# Patient Record
Sex: Female | Born: 1980 | Hispanic: Yes | Marital: Married | Smoking: Former smoker
Health system: Southern US, Community
[De-identification: ages and names within clinical notes are randomized; demographics above are authoritative.]

## PROBLEM LIST (undated history)

## (undated) DIAGNOSIS — M5126 Other intervertebral disc displacement, lumbar region: Secondary | ICD-10-CM

## (undated) DIAGNOSIS — G43909 Migraine, unspecified, not intractable, without status migrainosus: Secondary | ICD-10-CM

## (undated) DIAGNOSIS — J45909 Unspecified asthma, uncomplicated: Secondary | ICD-10-CM

---

## 2015-10-15 ENCOUNTER — Encounter (HOSPITAL_COMMUNITY): Payer: Self-pay | Admitting: Emergency Medicine

## 2015-10-15 ENCOUNTER — Emergency Department (HOSPITAL_COMMUNITY): Payer: Managed Care, Other (non HMO)

## 2015-10-15 ENCOUNTER — Emergency Department (HOSPITAL_COMMUNITY)
Admission: EM | Admit: 2015-10-15 | Discharge: 2015-10-15 | Disposition: A | Discharge status: Home / Self Care | Payer: Managed Care, Other (non HMO) | Attending: Emergency Medicine | Admitting: Emergency Medicine

## 2015-10-15 DIAGNOSIS — Z79899 Other long term (current) drug therapy: Secondary | ICD-10-CM | POA: Diagnosis not present

## 2015-10-15 DIAGNOSIS — J45909 Unspecified asthma, uncomplicated: Secondary | ICD-10-CM | POA: Insufficient documentation

## 2015-10-15 DIAGNOSIS — Z87891 Personal history of nicotine dependence: Secondary | ICD-10-CM | POA: Diagnosis not present

## 2015-10-15 DIAGNOSIS — R51 Headache: Secondary | ICD-10-CM | POA: Insufficient documentation

## 2015-10-15 DIAGNOSIS — R519 Headache, unspecified: Secondary | ICD-10-CM

## 2015-10-15 HISTORY — DX: Migraine, unspecified, not intractable, without status migrainosus: G43.909

## 2015-10-15 HISTORY — DX: Other intervertebral disc displacement, lumbar region: M51.26

## 2015-10-15 HISTORY — DX: Unspecified asthma, uncomplicated: J45.909

## 2015-10-15 MED ORDER — METHYLPREDNISOLONE 4 MG PO TBPK: ORAL_TABLET | ORAL | Status: DC

## 2015-10-15 MED ORDER — KETOROLAC TROMETHAMINE 60 MG/2ML IM SOLN
60.0000 mg | Freq: Once | INTRAMUSCULAR | Status: AC
  Administered 2015-10-15: 60 mg via INTRAMUSCULAR
  Filled 2015-10-15: qty 2

## 2015-10-15 MED ORDER — DEXAMETHASONE SODIUM PHOSPHATE 10 MG/ML IJ SOLN
6.0000 mg | Freq: Once | INTRAMUSCULAR | Status: AC
  Administered 2015-10-15: 6 mg via INTRAMUSCULAR
  Filled 2015-10-15: qty 1

## 2015-10-15 MED ORDER — IBUPROFEN 800 MG PO TABS: 800.0000 mg | ORAL_TABLET | Freq: Three times a day (TID) | ORAL | Status: DC

## 2015-10-15 NOTE — ED Provider Notes (Signed)
CSN: [id number]     Arrival date & time 10/15/15  1205 History   First MD Initiated Contact with Patient 10/15/15 1305     Chief Complaint  Patient presents with  . Headache     (Consider location/radiation/quality/duration/timing/severity/associated sxs/prior Treatment) HPI  The pt has had headache for 24 hours - this headache is worse behind her eyes and is throbbing with dizzines and light sensitivity - no changes in vision and no numbness or weakness of arms or legs other than LUE numbness into the L 4th and 5th fingers.  This HA is constant, the arm sx have bee present for 2 weeks intermittently - not to bad at this time - the sx radiate from her neck on the L down her L arm and is sharp pain with pins and needles feeling. -she reports having MRI's of the neck and lower back showing lots of bone spurs and arthritis as well as some herniated disks.  She has used muscle relaxer and opiates at home with minimal relief of arm sx.    Past Medical History  Diagnosis Date  . Migraine   . Lumbar herniated disc   . Asthma     as child   History reviewed. No pertinent past surgical history. No family history on file. Social History  Substance Use Topics  . Smoking status: Former Games developermoker  . Smokeless tobacco: None  . Alcohol Use: No   OB History    No data available     Review of Systems  All other systems reviewed and are negative.     Allergies  Review of patient's allergies indicates no known allergies.  Home Medications   Prior to Admission medications   Medication Sig Start Date End Date Taking? Authorizing Provider  aspirin-acetaminophen-caffeine (EXCEDRIN MIGRAINE) [id number] [id number] MG tablet Take 2 tablets by mouth every 6 (six) hours as needed for headache.   Yes Historical Provider, MD  cetirizine (ZYRTEC) 10 MG tablet Take 10 mg by mouth daily.   Yes Historical Provider, MD  cyclobenzaprine (FLEXERIL) 10 MG tablet Take 10 mg by mouth 3 (three) times daily as needed for  muscle spasms.   Yes Historical Provider, MD  naproxen (NAPROSYN) 500 MG tablet Take 500 mg by mouth 2 (two) times daily with a meal.   Yes Historical Provider, MD  naproxen sodium (ANAPROX) 220 MG tablet Take 440 mg by mouth 2 (two) times daily with a meal.   Yes Historical Provider, MD  oxyCODONE-acetaminophen (PERCOCET/ROXICET) 5-325 MG tablet Take 1 tablet by mouth every 4 (four) hours as needed for severe pain.   Yes Historical Provider, MD  ibuprofen (ADVIL,MOTRIN) 800 MG tablet Take 1 tablet (800 mg total) by mouth 3 (three) times daily. 10/15/15   Eber HongBrian Paeton Latouche, MD  methylPREDNISolone (MEDROL DOSEPAK) 4 MG TBPK tablet Taper over 6 days 10/15/15   Eber HongBrian Kerri Asche, MD   BP 108/74 mmHg  Pulse 75  Temp(Src) 97.9 F (36.6 C) (Oral)  Resp 16  Ht 5\' 3"  (1.6 m)  Wt 180 lb (81.647 kg)  BMI 31.89 kg/m2  SpO2 99%  LMP 09/02/2015 Physical Exam  Constitutional: She appears well-developed and well-nourished. No distress.  HENT:  Head: Normocephalic and atraumatic.  Mouth/Throat: Oropharynx is clear and moist. No oropharyngeal exudate.  Eyes: Conjunctivae and EOM are normal. Right eye exhibits no discharge. Left eye exhibits no discharge. No scleral icterus.  R pupil larger than the L but both are reactive.  Neck: Normal range of motion. Neck supple. No JVD  present. No thyromegaly present.  Cardiovascular: Normal rate, regular rhythm, normal heart sounds and intact distal pulses.  Exam reveals no gallop and no friction rub.   No murmur heard. Pulmonary/Chest: Effort normal and breath sounds normal. No respiratory distress. She has no wheezes. She has no rales.  Abdominal: Soft. Bowel sounds are normal. She exhibits no distension and no mass. There is no tenderness.  Musculoskeletal: Normal range of motion. She exhibits no edema or tenderness.  Lymphadenopathy:    She has no cervical adenopathy.  Neurological: She is alert. Coordination normal.  Neurologic exam:  Speech clear, pupils equal round  reactive to light, extraocular movements intact  Normal peripheral visual fields Cranial nerves III through XII normal including no facial droop Follows commands, moves all extremities x4, normal strength to bilateral upper and lower extremities at all major muscle groups including grip Sensation normal to light touch and pinprick other than the 4th and 5th digits of the LUE.  Normal strength of L hand and ulnar nerve. Coordination intact, no limb ataxia, finger-nose-finger normal Rapid alternating movements normal No pronator drift Gait normal   Skin: Skin is warm and dry. No rash noted. No erythema.  Psychiatric: She has a normal mood and affect. Her behavior is normal.  Nursing note and vitals reviewed.   ED Course  Procedures (including critical care time) Labs Review Labs Reviewed - No data to display  Imaging Review Ct Head Wo Contrast  10/15/2015  CLINICAL DATA:  Headache. Photosensitivity. Nausea. Dizziness. Left arm pain for the past 2 weeks. EXAM: CT HEAD WITHOUT CONTRAST TECHNIQUE: Contiguous axial images were obtained from the base of the skull through the vertex without intravenous contrast. COMPARISON:  None. FINDINGS: Normal appearing cerebral hemispheres and posterior fossa structures. Normal size and position of the ventricles. No intracranial hemorrhage, mass lesion or CT evidence of acute infarction. Unremarkable bones and included paranasal sinuses. IMPRESSION: Normal examination. Electronically Signed   By: Beckie Salts M.D.   On: 10/15/2015 14:56   I have personally reviewed and evaluated these images and lab results as part of my medical decision-making.    MDM   Final diagnoses:  Nonintractable headache, unspecified chronicity pattern, unspecified headache type    Pt has new headache (states gets them occasionally) but has anisocoria - CT head Has neck pain with neuro sx suggesting underlying radiculopathy - has had imaging - no weakness nsaids /  steroids Pt in agreement.  Improved after meds No headache on d/c Xray neg  F/u list given  Meds given in ED:  Medications  dexamethasone (DECADRON) injection 6 mg (6 mg Intramuscular Given 10/15/15 1415)  ketorolac (TORADOL) injection 60 mg (60 mg Intramuscular Given 10/15/15 1416)    New Prescriptions   IBUPROFEN (ADVIL,MOTRIN) 800 MG TABLET    Take 1 tablet (800 mg total) by mouth 3 (three) times daily.   METHYLPREDNISOLONE (MEDROL DOSEPAK) 4 MG TBPK TABLET    Taper over 6 days      Eber Hong, MD 10/15/15 1537

## 2015-10-15 NOTE — Discharge Instructions (Signed)
[facility] Doctor List ° ° ° °Edward Hawkins MD. Specialty: Pulmonary Disease Contact information: [address]  °[address]  °[place] [address]  °[phone]  ° °Margaret Simpson, MD. Specialty: Family Medicine Contact information: [address]  °[place] [address]  °[phone]  ° °Scott Luking, MD. Specialty: Family Medicine Contact information: [address]  °[address]  °[place] [address]  °[phone]  ° °Tesfaye Fanta, MD Specialty: Internal Medicine Contact information: [address]  °[place] [address]  °[phone]  ° °Zach Hall, MD. Specialty: Internal Medicine Contact information: [address]  °[place] [address]  °[phone]  ° °Angus Mcinnis, MD. Specialty: Family Medicine Contact information: [address]  °[place] [address]  °[phone]  ° °Stephen Knowlton, MD. Specialty: Family Medicine Contact information: [address]  °[address]  °[place] [address]  °[phone]  ° °Roy Fagan, MD. Specialty: Internal Medicine Contact information: [address]  °[address]  °[place] [address]  °[phone]  ° ° °

## 2015-10-15 NOTE — ED Notes (Signed)
Pt c/o HA, light sensitivity, nausea, intermittent dizziness, and LT arm pain x 2 weeks. Pt has hx of herniated discs in lower back and pt states this sometimes causes symptoms.

## 2017-02-18 ENCOUNTER — Other Ambulatory Visit: Payer: Self-pay | Admitting: Family Medicine

## 2017-02-18 ENCOUNTER — Other Ambulatory Visit (HOSPITAL_COMMUNITY)
Admission: RE | Admit: 2017-02-18 | Discharge: 2017-02-18 | Disposition: A | Discharge status: Home / Self Care | Payer: Managed Care, Other (non HMO) | Source: Ambulatory Visit | Attending: Family Medicine | Admitting: Family Medicine

## 2017-02-18 DIAGNOSIS — Z01411 Encounter for gynecological examination (general) (routine) with abnormal findings: Secondary | ICD-10-CM | POA: Insufficient documentation

## 2017-02-20 LAB — CYTOLOGY - PAP
Adequacy: ABSENT
Diagnosis: NEGATIVE
HPV: NOT DETECTED

## 2019-05-25 ENCOUNTER — Other Ambulatory Visit: Payer: Self-pay

## 2019-05-25 DIAGNOSIS — Z20822 Contact with and (suspected) exposure to covid-19: Secondary | ICD-10-CM

## 2019-05-26 LAB — NOVEL CORONAVIRUS, NAA: SARS-CoV-2, NAA: NOT DETECTED

## 2020-08-28 ENCOUNTER — Ambulatory Visit: Payer: 59 | Admitting: Neurology

## 2020-12-06 ENCOUNTER — Other Ambulatory Visit: Payer: Self-pay

## 2020-12-06 ENCOUNTER — Encounter (HOSPITAL_COMMUNITY): Payer: Self-pay | Admitting: *Deleted

## 2020-12-06 ENCOUNTER — Emergency Department (HOSPITAL_COMMUNITY)
Admission: EM | Admit: 2020-12-06 | Discharge: 2020-12-06 | Disposition: A | Discharge status: Home / Self Care | Payer: 59 | Attending: Emergency Medicine | Admitting: Emergency Medicine

## 2020-12-06 ENCOUNTER — Emergency Department (HOSPITAL_COMMUNITY): Payer: 59

## 2020-12-06 DIAGNOSIS — R0789 Other chest pain: Secondary | ICD-10-CM | POA: Insufficient documentation

## 2020-12-06 DIAGNOSIS — J45909 Unspecified asthma, uncomplicated: Secondary | ICD-10-CM | POA: Diagnosis not present

## 2020-12-06 DIAGNOSIS — R519 Headache, unspecified: Secondary | ICD-10-CM | POA: Insufficient documentation

## 2020-12-06 DIAGNOSIS — Z87891 Personal history of nicotine dependence: Secondary | ICD-10-CM | POA: Diagnosis not present

## 2020-12-06 LAB — BASIC METABOLIC PANEL
Anion gap: 7 (ref 5–15)
BUN: 13 mg/dL (ref 6–20)
CO2: 24 mmol/L (ref 22–32)
Calcium: 8.1 mg/dL — ABNORMAL LOW (ref 8.9–10.3)
Chloride: 106 mmol/L (ref 98–111)
Creatinine, Ser: 0.73 mg/dL (ref 0.44–1.00)
GFR, Estimated: >60 mL/min (ref >60)
Glucose, Bld: 114 mg/dL — ABNORMAL HIGH (ref 70–99)
Potassium: 3.5 mmol/L (ref 3.5–5.1)
Sodium: 137 mmol/L (ref 135–145)

## 2020-12-06 LAB — CBC
HCT: 38.2 % (ref 36.0–46.0)
Hemoglobin: 11.5 g/dL — ABNORMAL LOW (ref 12.0–15.0)
MCH: 24.9 pg — ABNORMAL LOW (ref 26.0–34.0)
MCHC: 30.1 g/dL (ref 30.0–36.0)
MCV: 82.7 fL (ref 80.0–100.0)
Platelets: 346 10*3/uL (ref 150–400)
RBC: 4.62 MIL/uL (ref 3.87–5.11)
RDW: 13.8 % (ref 11.5–15.5)
WBC: 8.2 10*3/uL (ref 4.0–10.5)
nRBC: 0 % (ref 0.0–0.2)

## 2020-12-06 LAB — TROPONIN I (HIGH SENSITIVITY)
Troponin I (High Sensitivity): <2 ng/L (ref <18)
Troponin I (High Sensitivity): 2 ng/L (ref <18)

## 2020-12-06 MED ORDER — CYCLOBENZAPRINE HCL 10 MG PO TABS: 10.0000 mg | ORAL_TABLET | Freq: Two times a day (BID) | ORAL | 0 refills | Status: AC | PRN

## 2020-12-06 MED ORDER — ONDANSETRON HCL 4 MG/2ML IJ SOLN
4.0000 mg | Freq: Once | INTRAMUSCULAR | Status: AC
  Administered 2020-12-06: 4 mg via INTRAVENOUS
  Filled 2020-12-06: qty 2

## 2020-12-06 MED ORDER — METOCLOPRAMIDE HCL 5 MG/ML IJ SOLN
10.0000 mg | Freq: Once | INTRAMUSCULAR | Status: AC
  Administered 2020-12-06: 10 mg via INTRAVENOUS
  Filled 2020-12-06: qty 2

## 2020-12-06 MED ORDER — SODIUM CHLORIDE 0.9 % IV BOLUS
1000.0000 mL | Freq: Once | INTRAVENOUS | Status: AC
  Administered 2020-12-06: 1000 mL via INTRAVENOUS

## 2020-12-06 MED ORDER — CYCLOBENZAPRINE HCL 10 MG PO TABS
10.0000 mg | ORAL_TABLET | Freq: Once | ORAL | Status: AC
  Administered 2020-12-06: 10 mg via ORAL
  Filled 2020-12-06: qty 1

## 2020-12-06 MED ORDER — DIPHENHYDRAMINE HCL 50 MG/ML IJ SOLN
25.0000 mg | Freq: Once | INTRAMUSCULAR | Status: AC
  Administered 2020-12-06: 25 mg via INTRAVENOUS
  Filled 2020-12-06: qty 1

## 2020-12-06 NOTE — ED Provider Notes (Signed)
[facility]   CSN: [id number] Arrival date & time: 12/06/20  1124     History Chief Complaint  Patient presents with  . Headache    [facility] is a 40 y.o. female.  HPI  Patient with significant medical history of asthma, migraines presents to the emergency [facility] with chief complaint of migraines.  Patient states she has been having headache for the last 2 days, states the pain comes and goes, states is on the left side of her head, feels like a pressure-like sensation, she has intermittent paresthesias in the upper extremities, denies any weakness, currently has no paresthesias at this time, she denies recent head trauma, is not on anticoagulant, she does endorse that she will see floaters in her left eye this only happens when she goes from a lighted area into dark area and will resolve on own.  She has no history of IV drug use, denies associated fevers or chills, she states that this headache came on suddenly but it has gradually gotten worse.  States this feels different than her normal migraines.  She also comes in with complaints of left-sided chest pain, started today, this pain is intermittent, does not radiate, not associated with shortness of breath, nausea, vomiting, lightheadedness or dizziness, she has no cardiac history, no history of PEs or DVTs, currently not on hormone therapy.  Patient denies any alleviating factors.  Past Medical History:  Diagnosis Date  . Asthma    as child  . Lumbar herniated disc   . Migraine     There are no problems to display for this patient.   History reviewed. No pertinent surgical history.   OB History   No obstetric history on file.     No family history on file.  Social History   Tobacco Use  . Smoking status: Former    Pack years: 0.00  . Smokeless tobacco: Never  Substance Use Topics  . Alcohol use: No    Home Medications Prior to Admission medications   Medication Sig  Start Date End Date Taking? Authorizing Provider  cetirizine (ZYRTEC) 10 MG tablet Take 10 mg by mouth daily.   Yes [provider]  cyclobenzaprine (FLEXERIL) 10 MG tablet Take 1 tablet (10 mg total) by mouth 2 (two) times daily as needed for muscle spasms. 12/06/20  Yes Carroll SageFaulkner, Calia Napp J, PA-C  naproxen (NAPROSYN) 500 MG tablet Take 500 mg by mouth 2 (two) times daily with a meal.   Yes [provider]  oxyCODONE-acetaminophen (PERCOCET/ROXICET) 5-325 MG tablet Take 1 tablet by mouth every 4 (four) hours as needed for severe pain.   Yes [provider]  ibuprofen (ADVIL,MOTRIN) 800 MG tablet Take 1 tablet (800 mg total) by mouth 3 (three) times daily. Patient not taking: Reported on 12/06/2020 10/15/15   Eber HongMiller, Brian, MD  methylPREDNISolone (MEDROL DOSEPAK) 4 MG TBPK tablet Taper over 6 days Patient not taking: Reported on 12/06/2020 10/15/15   Eber HongMiller, Brian, MD  naproxen sodium (ANAPROX) 220 MG tablet Take 440 mg by mouth 2 (two) times daily with a meal. Patient not taking: Reported on 12/06/2020    [provider]    Allergies    Patient has no known allergies.  Review of Systems   Review of Systems  Constitutional:  Negative for chills and fever.  HENT:  Negative for congestion.   Eyes:  Positive for photophobia.  Respiratory:  Negative for shortness of breath.   Cardiovascular:  Positive for chest  pain.  Gastrointestinal:  Positive for nausea. Negative for abdominal pain, diarrhea and vomiting.  Genitourinary:  Negative for enuresis.  Musculoskeletal:  Negative for back pain.  Skin:  Negative for rash.  Neurological:  Positive for dizziness and headaches. Negative for facial asymmetry.  Hematological:  Does not bruise/bleed easily.   Physical Exam Updated Vital Signs BP 98/71   Pulse 63   Temp 98.4 F (36.9 C) (Oral)   Resp 15   LMP 11/22/2020 (Within Weeks)   SpO2 96%   Physical Exam Vitals and nursing note reviewed.  Constitutional:       General: She is not in acute distress.    Appearance: She is not ill-appearing.  HENT:     Head: Normocephalic and atraumatic.     Nose: No congestion.  Eyes:     General: No visual field deficit.    Extraocular Movements: Extraocular movements intact.     Conjunctiva/sclera: Conjunctivae normal.     Pupils: Pupils are equal, round, and reactive to light.  Cardiovascular:     Rate and Rhythm: Normal rate and regular rhythm.     Pulses: Normal pulses.     Heart sounds: No murmur heard.   No friction rub. No gallop.  Pulmonary:     Effort: No respiratory distress.     Breath sounds: No wheezing, rhonchi or rales.  Abdominal:     Palpations: Abdomen is soft.     Tenderness: There is no abdominal tenderness.  Musculoskeletal:     Comments: Patient has 5 of 5 strength, neurovascularly intact in the upper and lower extremities.  Skin:    General: Skin is warm and dry.  Neurological:     Mental Status: She is alert.     GCS: GCS eye subscore is 4. GCS verbal subscore is 5. GCS motor subscore is 6.     Cranial Nerves: No facial asymmetry.     Motor: No weakness.     Coordination: Romberg sign negative. Finger-Nose-Finger Test normal.     Comments: Cranial nerves II through XII are grossly intact  Patient is having no difficulty with word finding.  Patient states the sensation on her right upper extremity and right side of her face feels rough compared to her left side.  Psychiatric:        Mood and Affect: Mood normal.    ED Results / Procedures / Treatments   Labs (all labs ordered are listed, but only abnormal results are displayed) Labs Reviewed  BASIC METABOLIC PANEL - Abnormal; Notable for the following components:      Result Value   Glucose, Bld 114 (*)    Calcium 8.1 (*)    All other components within normal limits  CBC - Abnormal; Notable for the following components:   Hemoglobin 11.5 (*)    MCH 24.9 (*)    All other components within normal limits  TROPONIN I  (HIGH SENSITIVITY)  TROPONIN I (HIGH SENSITIVITY)    EKG None  Radiology DG Chest 2 View  Result Date: 12/06/2020 CLINICAL DATA:  Chest pain.  Headache.  Symptoms for 2 days. EXAM: CHEST - 2 VIEW COMPARISON:  None. FINDINGS: Heart size is normal. The lungs are free of focal consolidations and pleural effusions. No pulmonary edema. IMPRESSION: No active cardiopulmonary disease. Electronically Signed   By: Norva Pavlov M.D.   On: 12/06/2020 12:44   CT Head Wo Contrast  Result Date: 12/06/2020 CLINICAL DATA:  Headache, chronic, new features or increased frequency. Additional history  provided: Headache with left eye pain, cramps in legs with weakness. EXAM: CT HEAD WITHOUT CONTRAST TECHNIQUE: Contiguous axial images were obtained from the base of the skull through the vertex without intravenous contrast. COMPARISON:  Prior head CT 10/15/2015. FINDINGS: Brain: Cerebral volume is normal for age. There is no acute intracranial hemorrhage. No demarcated cortical infarct. No extra-axial fluid collection. No evidence of an intracranial mass. No midline shift. Vascular: No hyperdense vessel. Skull: No calvarial fracture or focal suspicious osseous lesion. Sinuses/Orbits: Visualized orbits show no acute finding. Minimal bilateral ethmoid and sphenoid sinus mucosal thickening at the imaged levels. IMPRESSION: Unremarkable non-contrast CT appearance of the brain. No evidence of acute intracranial abnormality. Minimal bilateral ethmoid and sphenoid sinus mucosal thickening at the imaged levels. Electronically Signed   By: Jackey Loge DO   On: 12/06/2020 14:04    Procedures Procedures   Medications Ordered in ED Medications  sodium chloride 0.9 % bolus 1,000 mL (1,000 mLs Intravenous New Bag/Given 12/06/20 1309)  ondansetron (ZOFRAN) injection 4 mg (4 mg Intravenous Given 12/06/20 1310)  metoCLOPramide (REGLAN) injection 10 mg (10 mg Intravenous Given 12/06/20 1312)  diphenhydrAMINE (BENADRYL) injection 25  mg (25 mg Intravenous Given 12/06/20 1313)  cyclobenzaprine (FLEXERIL) tablet 10 mg (10 mg Oral Given 12/06/20 1309)    ED Course  I have reviewed the triage vital signs and the nursing notes.  Pertinent labs & imaging results that were available during my care of the patient were reviewed by me and considered in my medical decision making (see chart for details).    MDM Rules/Calculators/A&P                         Initial impression-patient presents with a migraine and chest pain.  She is alert, does not appear in acute stress, vital signs reassuring.  I suspect patient is having from a simple migraine, will provide patient with migraine cocktail, CT head as she endorses new type of headache.  Suspect chest pain is musculoskeletal will obtain basic lab work-up, provide patient with Flexeril and reassess.  Work-up-CBC shows normocytic anemia with a hemoglobin 11.5, BMP shows slight hyperglycemia 114, negative delta troponin. chest x-ray unremarkable, EKG sinus without signs of ischemia CT head negative for acute findings.  Reassessment-patient was reassessed, states she is feeling much better, has no complaints this time.  Vital signs remained stable.  will continue to monitor.  Patient is reassessed continued no complaints this time, vital signs remained stable, patient is agreement for discharge at this time.  Rule out-I have low suspicion for intracranial head bleed or intracranial mass as CT head is negative for acute findings.  Low suspicion for CVA or subarachnoid bleed as there is no focal deficits present my exam, history is atypical as headache came on gradually, patient has low risk factors.  Low suspicion for systemic infection and/or meningitis as white count was within normal limits, vital signs reassuring, no meningeal sign present my exam.  I have low suspicion for ACS as history is atypical, patient has no cardiac history, EKG was sinus rhythm without signs of ischemia, patient had  a delta troponin.  Low suspicion for PE as patient denies pleuritic chest pain, shortness of breath, patient denies leg pain, no pedal edema noted on exam, patient was PERC negative.  Low suspicion for AAA or aortic dissection as history is atypical, patient has low risk factors.  Low suspicion for systemic infection as patient is nontoxic-appearing, vital signs reassuring,  no obvious source infection noted on exam.   Plan-  Headache since resolved-suspect patient suffering from a migraine, will recommend she follows up with neurology for further evaluation. Chest pain since resolved-suspect musculoskeletal as she improved with a muscle relaxer, will provide her with muscle relaxers, follow with PCP as needed.  Vital signs have remained stable, no indication for hospital admission.   Patient given at home care as well strict return precautions.  Patient verbalized that they understood agreed to said plan.  Final Clinical Impression(s) / ED Diagnoses Final diagnoses:  Bad headache  Atypical chest pain    Rx / DC Orders ED Discharge Orders          Ordered    cyclobenzaprine (FLEXERIL) 10 MG tablet  2 times daily PRN        12/06/20 1506             Barnie Del 12/06/20 1510    Bethann Berkshire, MD 12/10/20 [phone]

## 2020-12-06 NOTE — ED Triage Notes (Signed)
States she also has chest pain

## 2020-12-06 NOTE — Discharge Instructions (Addendum)
Lab work and imaging are reassuring.  I suspect you had a migraine.  I recommend over-the-counter pain medications like ibuprofen and Tylenol every 6 hours as needed please follow dosing on the back of bottle.  Please stay hydrated, exercise regularly, decrease stress as these can help decrease frequency of migraines.  I suspect chest pain is a muscular strain, recommend Flexeril as needed for pain relief.  Please beware that this medication make you drowsy do not consume alcohol or operate heavy machinery when taking this medication.  Please follow-up with neurology for further evaluation.  Come back to the emergency department if you develop chest pain, shortness of breath, severe abdominal pain, uncontrolled nausea, vomiting, diarrhea.

## 2020-12-06 NOTE — ED Triage Notes (Signed)
Sent from urgent care for evaluation, headache for 2 days

## 2020-12-10 ENCOUNTER — Encounter: Payer: Self-pay | Admitting: Neurology

## 2021-02-27 NOTE — Progress Notes (Signed)
NEUROLOGY CONSULTATION NOTE  Rhonda Jimenez MRN: [id number] DOB: Sep 15, 1980  Referring provider: Bethann Berkshire, MD (ED referral) Primary care provider: No PCP  Reason for consult:  headache  Assessment/Plan:   New onset headache, different from typical migraine but likely intractable migraine with aura.  Given new symptoms (speech/language disturbance and right sided numbness), must evaluate for secondary intracranial etiology Migraine without aura, without status migrainosus, not intractable  MRI of brain with and without contrast Typical migraines are infrequent so would defer starting a preventative For migraine rescue:  rizatriptan 10mg  and Zofran 4mg  Limit use of pain relievers to no more than 2 days out of week to prevent risk of rebound or medication-overuse headache. Keep headache diary Follow up 6 months.      Subjective:  Rhonda Jimenez is a 40 year old female with asthma and migraines who presents for migraines.  ED note reviewed.  In June 2022, she developed a new headache described as a severe right occipital exploding headache radiating forward along the right side of her head.  She has gray out of vision on the lower half of visual field in right eye lasting one hour.  Headache associated with nausea and photophobia.  Over the course of 2 weeks during this period, she reported stuttering speech and word-finding difficulty as well as tingling and shooting pain in all extremities.  She also noted dysesthesia involving the right arm and neck.  NO WEAKNESS.  Tried treating headache with naproxen and Excedrin with no meaningful improvement.  She was seen in the ED on 12/06/2020.  CT head personally reviewed was unremarkable. After 2 weeks, she has not had a recurrence of these headaches or symptoms.  Usual migraines described as holocephalic throbbing and stabbing pain associated with nausea, photophobia and phonophobia usually lasting a day (up to 3 days) and occurring 2-3 times  a month.    Current NSAIDS/analgesics:  naproxen 500mg  BID (chronic back pain), oxycodone-acetaminophen (chronic low back pain) Current triptans:  none Current ergotamine:  none Current anti-emetic:  none Current muscle relaxants:  cyclobenzaprine 10mg  BID PRN (chronic back pain/spasms) Current Antihypertensive medications:  none Current Antidepressant medications:  none Current Anticonvulsant medications:  none Current anti-CGRP:  none Current Vitamins/Herbal/Supplements:  none Current Antihistamines/Decongestants:  cetirizine Other therapy:  none Hormone/birth control:  none  Past NSAIDS/analgesics:  ibuprofen, Excedrin Migraine none Past abortive triptans:  none Past abortive ergotamine:  none Past muscle relaxants:  none Past anti-emetic:  none Past antihypertensive medications:  none Past antidepressant medications:  none Past anticonvulsant medications:  none Past anti-CGRP:  none Past vitamins/Herbal/Supplements:  none Past antihistamines/decongestants:  none Other past therapies:  none  Family history of headache:  no      PAST MEDICAL HISTORY: Past Medical History:  Diagnosis Date   Asthma    as child   Lumbar herniated disc    Migraine     PAST SURGICAL HISTORY: No past surgical history on file.  MEDICATIONS: Current Outpatient Medications on File Prior to Visit  Medication Sig Dispense Refill   cetirizine (ZYRTEC) 10 MG tablet Take 10 mg by mouth daily.     cyclobenzaprine (FLEXERIL) 10 MG tablet Take 1 tablet (10 mg total) by mouth 2 (two) times daily as needed for muscle spasms. 20 tablet 0   ibuprofen (ADVIL,MOTRIN) 800 MG tablet Take 1 tablet (800 mg total) by mouth 3 (three) times daily. (Patient not taking: Reported on 12/06/2020) 21 tablet 0   methylPREDNISolone (MEDROL DOSEPAK) 4 MG TBPK tablet  Taper over 6 days (Patient not taking: Reported on 12/06/2020) 21 tablet 0   naproxen (NAPROSYN) 500 MG tablet Take 500 mg by mouth 2 (two) times daily  with a meal.     naproxen sodium (ANAPROX) 220 MG tablet Take 440 mg by mouth 2 (two) times daily with a meal. (Patient not taking: Reported on 12/06/2020)     oxyCODONE-acetaminophen (PERCOCET/ROXICET) 5-325 MG tablet Take 1 tablet by mouth every 4 (four) hours as needed for severe pain.     No current facility-administered medications on file prior to visit.    ALLERGIES: No Known Allergies  FAMILY HISTORY: No family history on file.  Objective:  Blood pressure 121/79, pulse 99, height 5\' 3"  (1.6 m), weight 190 lb 6.4 oz (86.4 kg). General: No acute distress.  Patient appears well-groomed.   Head:  Normocephalic/atraumatic Eyes:  fundi examined but not visualized Neck: supple, no paraspinal tenderness, full range of motion Back: No paraspinal tenderness Heart: regular rate and rhythm Lungs: Clear to auscultation bilaterally. Vascular: No carotid bruits. Neurological Exam: Mental status: alert and oriented to person, place, and time, recent and remote memory intact, fund of knowledge intact, attention and concentration intact, speech fluent and not dysarthric, language intact. Cranial nerves: CN I: not tested CN II: pupils equal, round and reactive to light, visual fields intact CN III, IV, VI:  full range of motion, no nystagmus, no ptosis CN V: reports decreased sensation left V1 CN VII: upper and lower face symmetric CN VIII: hearing intact CN IX, X: gag intact, uvula midline CN XI: sternocleidomastoid and trapezius muscles intact CN XII: tongue midline Bulk & Tone: normal, no fasciculations. Motor:  muscle strength 5/5 throughout Sensation:  reports reduced pinprick in right upper extremity (inconsistent).  Vibratory sensation intact. Deep Tendon Reflexes:  2+ throughout,  toes downgoing.   Finger to nose testing:  Without dysmetria.   Heel to shin:  Without dysmetria.   Gait:  Normal station and stride.  Romberg negative.    Thank you for allowing me to take part in  the care of this patient.  , DO

## 2021-02-28 ENCOUNTER — Other Ambulatory Visit: Payer: Self-pay

## 2021-02-28 ENCOUNTER — Ambulatory Visit (INDEPENDENT_AMBULATORY_CARE_PROVIDER_SITE_OTHER): Payer: 59 | Admitting: Neurology

## 2021-02-28 ENCOUNTER — Encounter: Payer: Self-pay | Admitting: Neurology

## 2021-02-28 VITALS — BP 121/79 | HR 99 | Ht 63.0 in | Wt 190.4 lb

## 2021-02-28 DIAGNOSIS — G43009 Migraine without aura, not intractable, without status migrainosus: Secondary | ICD-10-CM

## 2021-02-28 DIAGNOSIS — R200 Anesthesia of skin: Secondary | ICD-10-CM | POA: Diagnosis not present

## 2021-02-28 DIAGNOSIS — R479 Unspecified speech disturbances: Secondary | ICD-10-CM

## 2021-02-28 DIAGNOSIS — R519 Headache, unspecified: Secondary | ICD-10-CM | POA: Diagnosis not present

## 2021-02-28 MED ORDER — ONDANSETRON 4 MG PO TBDP: 4.0000 mg | ORAL_TABLET | Freq: Three times a day (TID) | ORAL | 5 refills | Status: AC | PRN

## 2021-02-28 MED ORDER — RIZATRIPTAN BENZOATE 10 MG PO TBDP: 10.0000 mg | ORAL_TABLET | ORAL | 5 refills | Status: DC | PRN

## 2021-02-28 NOTE — Patient Instructions (Addendum)
  MRI of brain with and without contrast Take rizatriptan 10mg  at earliest onset of headache.  May repeat dose once in 2 hours if needed.  Maximum 2 tablets in 24 hours. Take ondansetron for nausea Limit use of pain relievers to no more than 2 days out of the week.  These medications include acetaminophen, NSAIDs (ibuprofen/Advil/Motrin, naproxen/Aleve, triptans (Imitrex/sumatriptan), Excedrin, and narcotics.  This will help reduce risk of rebound headaches. Be aware of common food triggers:  - Caffeine:  coffee, black tea, cola, Mt. Dew  - Chocolate  - Dairy:  aged cheeses (brie, blue, cheddar, gouda, [place], provolone, Palmer, Swiss, etc), chocolate milk, buttermilk, sour cream, limit eggs and yogurt  - Nuts, peanut butter  - Alcohol  - Cereals/grains:  FRESH breads (fresh bagels, sourdough, doughnuts), yeast productions  - Processed/canned/aged/cured meats (pre-packaged deli meats, hotdogs)  - MSG/glutamate:  soy sauce, flavor enhancer, pickled/preserved/marinated foods  - Sweeteners:  aspartame (Equal, Nutrasweet).  Sugar and Splenda are okay  - Vegetables:  legumes (lima beans, lentils, snow peas, fava beans, pinto peans, peas, garbanzo beans), sauerkraut, onions, olives, pickles  - Fruit:  avocados, bananas, citrus fruit (orange, lemon, grapefruit), mango  - Other:  Frozen meals, macaroni and cheese Routine exercise Stay adequately hydrated (aim for 64 oz water daily) Keep headache diary Maintain proper stress management Maintain proper sleep hygiene Do not skip meals Consider supplements:  magnesium citrate 400mg  daily, riboflavin 400mg  daily, coenzyme Q10 100mg  three times daily. A referral to [facility] has been placed for your MRI someone will contact you directly to schedule your appt. They are located at [address] [facility]. Please contact them directly by calling [phone] with any questions regarding your referral.

## 2021-03-01 ENCOUNTER — Encounter: Payer: Self-pay | Admitting: Neurology

## 2021-04-05 ENCOUNTER — Other Ambulatory Visit: Payer: Self-pay

## 2021-04-05 ENCOUNTER — Ambulatory Visit
Admission: RE | Admit: 2021-04-05 | Discharge: 2021-04-05 | Disposition: A | Discharge status: Home / Self Care | Payer: 59 | Source: Ambulatory Visit | Attending: Neurology | Admitting: Neurology

## 2021-04-05 DIAGNOSIS — G43009 Migraine without aura, not intractable, without status migrainosus: Secondary | ICD-10-CM

## 2021-04-05 DIAGNOSIS — R479 Unspecified speech disturbances: Secondary | ICD-10-CM

## 2021-04-05 DIAGNOSIS — R200 Anesthesia of skin: Secondary | ICD-10-CM

## 2021-04-05 DIAGNOSIS — R519 Headache, unspecified: Secondary | ICD-10-CM

## 2021-04-05 MED ORDER — GADOBENATE DIMEGLUMINE 529 MG/ML IV SOLN
17.0000 mL | Freq: Once | INTRAVENOUS | Status: AC | PRN
  Administered 2021-04-05: 17 mL via INTRAVENOUS

## 2021-04-09 NOTE — Progress Notes (Signed)
Phone just rings, no mailbox. No answer at 2:36 04/09/2021

## 2021-04-10 NOTE — Progress Notes (Signed)
Tried calling pt, No answer. VM full unable to LVM.  

## 2021-09-11 NOTE — Progress Notes (Signed)
? ?NEUROLOGY FOLLOW UP OFFICE NOTE ? ?Luretha Eberly ?[id number] ? ?Assessment/Plan:  ? ?Migraine with aura, without status migrainosus, intractable ?Migraine without aura, without status migrainosus, not intractable ?Chronic back pain - longstanding history since the [employer].  Told she has a couple of slipped discs. Treated at the [facility] ED with injections, naproxen and oxycodone.  Interested in seeing a specialist for treatment.   ?  ?Typical migraines are infrequent so would defer starting a preventative ?For migraine rescue:  rizatriptan 10mg  and Zofran 4mg  ?Limit use of pain relievers to no more than 2 days out of week to prevent risk of rebound or medication-overuse headache. ?Keep headache diary ?Refer to Sports Medicine regarding chronic back pain. ?Follow up 9 months. ?  ?  ?  ?  ?  ?Subjective:  ?Amrutha Sapp is a 41 year old NB with asthma and migraines who follows up for migraines. ? ?UPDATE: ?MRI of brain with and without contrast on 04/07/2021 personally reviewed was unremarkable. ? ?Intensity:  severe ?Duration:  30-60 minutes with rizatriptan ?Frequency:  once a month ?Rescue protocol:  rizatriptan 10mg , Zofran 4mg  ?Current NSAIDS/analgesics:  naproxen 500mg  BID (chronic back pain), oxycodone-acetaminophen (chronic low back pain) ?Current triptans:  rizatriptan 10mg  ?Current ergotamine:  none ?Current anti-emetic:  none ?Current muscle relaxants:  cyclobenzaprine 10mg  BID PRN (chronic back pain/spasms) ?Current Antihypertensive medications:  none ?Current Antidepressant medications:  none ?Current Anticonvulsant medications:  none ?Current anti-CGRP:  none ?Current Vitamins/Herbal/Supplements:  none ?Current Antihistamines/Decongestants:  cetirizine ?Other therapy:  none ?Hormone/birth control:  none ? ? ?HISTORY:  ?In June 2022, she developed a new headache described as a severe right occipital exploding headache radiating forward along the right side of her head.  She has gray out of vision on the lower  half of visual field in right eye lasting one hour.  Headache associated with nausea and photophobia.  Over the course of 2 weeks during this period, she reported stuttering speech and word-finding difficulty as well as tingling and shooting pain in all extremities.  She also noted dysesthesia involving the right arm and neck.  NO WEAKNESS.  Tried treating headache with naproxen and Excedrin with no meaningful improvement.  She was seen in the ED on 12/06/2020.  CT head personally reviewed was unremarkable. After 2 weeks, she has not had a recurrence of these headaches or symptoms. ?  ?Usual migraines described as holocephalic throbbing and stabbing pain associated with nausea, photophobia and phonophobia usually lasting a day (up to 3 days) and occurring 2-3 times a month.   ?  ? ?  ?Past NSAIDS/analgesics:  ibuprofen, Excedrin Migraine none ?Past abortive triptans:  none ?Past abortive ergotamine:  none ?Past muscle relaxants:  none ?Past anti-emetic:  Zofran (made her more nauseous) ?Past antihypertensive medications:  none ?Past antidepressant medications:  none ?Past anticonvulsant medications:  none ?Past anti-CGRP:  none ?Past vitamins/Herbal/Supplements:  none ?Past antihistamines/decongestants:  none ?Other past therapies:  none ?  ?Family history of headache:  no ? ?PAST MEDICAL HISTORY: ?Past Medical History:  ?Diagnosis Date  ? Asthma   ? as child  ? Lumbar herniated disc   ? Migraine   ? ? ?MEDICATIONS: ?Current Outpatient Medications on File Prior to Visit  ?Medication Sig Dispense Refill  ? cetirizine (ZYRTEC) 10 MG tablet Take 10 mg by mouth daily.    ? cyclobenzaprine (FLEXERIL) 10 MG tablet Take 1 tablet (10 mg total) by mouth 2 (two) times daily as needed for muscle spasms. 20 tablet 0  ?  ibuprofen (ADVIL,MOTRIN) 800 MG tablet Take 1 tablet (800 mg total) by mouth 3 (three) times daily. (Patient not taking: No sig reported) 21 tablet 0  ? methylPREDNISolone (MEDROL DOSEPAK) 4 MG TBPK tablet Taper  over 6 days (Patient not taking: No sig reported) 21 tablet 0  ? naproxen (NAPROSYN) 500 MG tablet Take 500 mg by mouth 2 (two) times daily with a meal.    ? naproxen sodium (ANAPROX) 220 MG tablet Take 440 mg by mouth 2 (two) times daily with a meal. (Patient not taking: No sig reported)    ? ondansetron (ZOFRAN ODT) 4 MG disintegrating tablet Take 1 tablet (4 mg total) by mouth every 8 (eight) hours as needed for nausea or vomiting. 20 tablet 5  ? oxyCODONE-acetaminophen (PERCOCET/ROXICET) 5-325 MG tablet Take 1 tablet by mouth every 4 (four) hours as needed for severe pain.    ? rizatriptan (MAXALT-MLT) 10 MG disintegrating tablet Take 1 tablet (10 mg total) by mouth as needed for migraine (May repeat in 2 hours.  Maximum 2 tablets in 24 hours.). May repeat in 2 hours if needed 10 tablet 5  ? ?No current facility-administered medications on file prior to visit.  ? ? ?ALLERGIES: ?No Known Allergies ? ?FAMILY HISTORY: ?Family History  ?Problem Relation Age of Onset  ? Stroke Mother   ? Lupus Mother   ? Arthritis-Osteo Mother   ? Heart attack Father   ? Lupus Sister   ? Fibromyalgia Sister   ? ? ?  ?Objective:  ?Blood pressure 140/90, pulse 98, resp. rate 18, height 5' 2.5" (1.588 m), weight 188 lb (85.3 kg), SpO2 98 %. ?General: No acute distress.  Patient appears well-groomed.   ?Head:  Normocephalic/atraumatic ?Eyes:  Fundi examined but not visualized ?Neck: supple, no paraspinal tenderness, full range of motion ?Heart:  Regular rate and rhythm ?Lungs:  Clear to auscultation bilaterally ?Back: No paraspinal tenderness ?Neurological Exam: alert and oriented to person, place, and time.  Speech fluent and not dysarthric, language intact.  CN II-XII intact. Bulk and tone normal, muscle strength 5/5 throughout.  Sensation to light touch intact.  Deep tendon reflexes 2+ throughout, toes downgoing.  Finger to nose testing intact.  Gait normal, Romberg negative. ? ? ?Shon Millet, DO ? ? ? ? ? ? ? ?

## 2021-09-13 ENCOUNTER — Encounter: Payer: Self-pay | Admitting: Neurology

## 2021-09-13 ENCOUNTER — Ambulatory Visit (INDEPENDENT_AMBULATORY_CARE_PROVIDER_SITE_OTHER): Payer: 59 | Admitting: Neurology

## 2021-09-13 VITALS — BP 140/90 | HR 98 | Resp 18 | Ht 62.5 in | Wt 188.0 lb

## 2021-09-13 DIAGNOSIS — G43009 Migraine without aura, not intractable, without status migrainosus: Secondary | ICD-10-CM

## 2021-09-13 DIAGNOSIS — G43109 Migraine with aura, not intractable, without status migrainosus: Secondary | ICD-10-CM

## 2021-09-13 DIAGNOSIS — G8929 Other chronic pain: Secondary | ICD-10-CM | POA: Diagnosis not present

## 2021-09-13 DIAGNOSIS — M549 Dorsalgia, unspecified: Secondary | ICD-10-CM | POA: Diagnosis not present

## 2021-09-13 MED ORDER — RIZATRIPTAN BENZOATE 10 MG PO TBDP: 10.0000 mg | ORAL_TABLET | ORAL | 5 refills | Status: AC | PRN

## 2021-09-13 NOTE — Patient Instructions (Signed)
Refer to sports medicine for chronic back pain ?Rizatriptan as directed ?

## 2021-09-25 ENCOUNTER — Encounter: Payer: Self-pay | Admitting: Family Medicine

## 2022-06-24 ENCOUNTER — Other Ambulatory Visit: Payer: Self-pay

## 2022-06-24 ENCOUNTER — Ambulatory Visit
Admission: EM | Admit: 2022-06-24 | Discharge: 2022-06-24 | Disposition: A | Discharge status: Home / Self Care | Payer: 59 | Attending: Nurse Practitioner | Admitting: Nurse Practitioner

## 2022-06-24 ENCOUNTER — Ambulatory Visit (INDEPENDENT_AMBULATORY_CARE_PROVIDER_SITE_OTHER): Payer: 59

## 2022-06-24 DIAGNOSIS — J4521 Mild intermittent asthma with (acute) exacerbation: Secondary | ICD-10-CM | POA: Diagnosis not present

## 2022-06-24 DIAGNOSIS — R079 Chest pain, unspecified: Secondary | ICD-10-CM | POA: Diagnosis not present

## 2022-06-24 DIAGNOSIS — R059 Cough, unspecified: Secondary | ICD-10-CM

## 2022-06-24 MED ORDER — IPRATROPIUM-ALBUTEROL 0.5-2.5 (3) MG/3ML IN SOLN
3.0000 mL | Freq: Once | RESPIRATORY_TRACT | Status: AC
  Administered 2022-06-24: 3 mL via RESPIRATORY_TRACT

## 2022-06-24 MED ORDER — PREDNISONE 20 MG PO TABS: 40.0000 mg | ORAL_TABLET | Freq: Every day | ORAL | 0 refills | Status: AC

## 2022-06-24 MED ORDER — METHYLPREDNISOLONE SODIUM SUCC 125 MG IJ SOLR
80.0000 mg | Freq: Once | INTRAMUSCULAR | Status: AC
  Administered 2022-06-24: 80 mg via INTRAMUSCULAR

## 2022-06-24 NOTE — ED Triage Notes (Signed)
Pt reports sob, fever on and off, coughing, chest tightness (feels like something is compressing her chest) and headache x 4 days. Took mucinex, Nyquil, albuterol, and Excedrin but no relief.

## 2022-06-24 NOTE — ED Provider Notes (Addendum)
[facility]    CSN: [id number] Arrival date & time: 06/24/22  1517      History   Chief Complaint No chief complaint on file.   HPI Rhonda Jimenez is a 42 y.o. adult.   Patient presents today for 5 days history of bodyaches, chills, dry occasional cough, shortness of breath, upper chest pain and tightness feels like something is pushing on the lungs, nasal congestion that has improved today, runny nose that is now better, hoarseness that is slowly improving.  Also having sneezing, sore throat that is now better, headache, bilateral ear pressure without drainage, and fatigue.  Denies wheezing, chest congestion, sinus pressure, abdominal pain, nausea/vomiting, diarrhea.  Reports when they sneeze, there is a cramp in her left rib that takes a second to workout.  She denies pain in her chest when she breathes in.  Has been drinking lots of water, electrolyte solutions and, and tried warm teas for symptoms without much benefit.  Reports history of asthma as a child that was severe and needed to be hospitalized multiple times.  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  Has been using albuterol inhaler very frequently which helps temporarily with symptoms.  Last menstrual period mid November.  Reports currently having irregular periods.  Is confident is not pregnant-does not have sex with males.     Past Medical History:  Diagnosis Date   Asthma    as child   Lumbar herniated disc    Migraine     There are no problems to display for this patient.   History reviewed. No pertinent surgical history.  OB History   No obstetric history on file.      Home Medications    Prior to Admission medications   Medication Sig Start Date End Date Taking? Authorizing Provider  predniSONE (DELTASONE) 20 MG tablet Take 2 tablets (40 mg total) by mouth daily with breakfast for 5 days. 06/24/22 06/29/22 Yes Eulogio Bear, NP  albuterol (VENTOLIN HFA) 108 (90 Base) MCG/ACT  inhaler SMARTSIG:2 Puff(s) By Mouth Every 4 Hours PRN 09/03/21   [provider]  cetirizine (ZYRTEC) 10 MG tablet Take 10 mg by mouth daily.    [provider]  cyclobenzaprine (FLEXERIL) 10 MG tablet Take 1 tablet (10 mg total) by mouth 2 (two) times daily as needed for muscle spasms. 12/06/20   Marcello Fennel, PA-C  naproxen (NAPROSYN) 500 MG tablet Take 500 mg by mouth 2 (two) times daily with a meal.    [provider]  ondansetron (ZOFRAN ODT) 4 MG disintegrating tablet Take 1 tablet (4 mg total) by mouth every 8 (eight) hours as needed for nausea or vomiting. 02/28/21   Pieter Partridge, DO  oxyCODONE-acetaminophen (PERCOCET/ROXICET) 5-325 MG tablet Take 1 tablet by mouth every 4 (four) hours as needed for severe pain.    [provider]  rizatriptan (MAXALT-MLT) 10 MG disintegrating tablet Take 1 tablet (10 mg total) by mouth as needed for migraine (May repeat in 2 hours.  Maximum 2 tablets in 24 hours.). May repeat in 2 hours if needed 09/13/21   Pieter Partridge, DO    Family History Family History  Problem Relation Age of Onset   Stroke Mother    Lupus Mother    Arthritis-Osteo Mother    Heart attack Father    Lupus Sister    Fibromyalgia Sister     Social History Social History   Tobacco Use   Smoking status: Former   Smokeless  tobacco: Never  Substance Use Topics   Alcohol use: No     Allergies   Patient has no known allergies.   Review of Systems Review of Systems Per HPI  Physical Exam Triage Vital Signs ED Triage Vitals  Enc Vitals Group     BP 06/24/22 1523 129/82     Pulse Rate 06/24/22 1523 95     Resp 06/24/22 1523 18     Temp 06/24/22 1523 98.9 F (37.2 C)     Temp Source 06/24/22 1523 Oral     SpO2 06/24/22 1523 97 %     Weight --      Height --      Head Circumference --      Peak Flow --      Pain Score 06/24/22 1527 5     Pain Loc --      Pain Edu? --      Excl. in GC? --    No data found.  Updated  Vital Signs BP 129/82 (BP Location: Right Arm)   Pulse 95   Temp 98.9 F (37.2 C) (Oral)   Resp 18   LMP 04/30/2022 Comment: reports irregular MP  SpO2 97%   Visual Acuity Right Eye Distance:   Left Eye Distance:   Bilateral Distance:    Right Eye Near:   Left Eye Near:    Bilateral Near:     Physical Exam Vitals and nursing note reviewed.  Constitutional:      General: Rhonda Jimenez is not in acute distress.    Appearance: Normal appearance. Rhonda Jimenez is not ill-appearing or toxic-appearing.  HENT:     Head: Normocephalic and atraumatic.     Right Ear: Tympanic membrane, ear canal and external ear normal.     Left Ear: Tympanic membrane, ear canal and external ear normal.     Nose: No congestion or rhinorrhea.     Mouth/Throat:     Mouth: Mucous membranes are moist.     Pharynx: Oropharynx is clear. No oropharyngeal exudate or posterior oropharyngeal erythema.  Eyes:     General: No scleral icterus.    Extraocular Movements: Extraocular movements intact.  Cardiovascular:     Rate and Rhythm: Normal rate and regular rhythm.  Pulmonary:     Effort: Pulmonary effort is normal. No respiratory distress.     Breath sounds: Normal breath sounds. Decreased air movement present. No wheezing, rhonchi or rales.  Chest:    Abdominal:     General: Abdomen is flat. Bowel sounds are normal. There is no distension.     Palpations: Abdomen is soft.     Tenderness: There is no abdominal tenderness. There is no guarding.  Musculoskeletal:     Cervical back: Normal range of motion and neck supple.  Lymphadenopathy:     Cervical: No cervical adenopathy.  Skin:    General: Skin is warm and dry.     Coloration: Skin is not jaundiced or pale.     Findings: No erythema or rash.  Neurological:     Mental Status: Rhonda Jimenez is alert and oriented to person, place, and time.  Psychiatric:        Behavior: Behavior is cooperative.      UC Treatments / Results  Labs (all labs  ordered are listed, but only abnormal results are displayed) Labs Reviewed - No data to display  EKG   Radiology DG Chest 2 View  Result Date: 06/24/2022 CLINICAL DATA:  cough x5 days, upper  chest pressure EXAM: CHEST - 2 VIEW COMPARISON:  Chest x-ray December 06, 2020. FINDINGS: The heart size and mediastinal contours are within normal limits. Both lungs are clear. No visible pleural effusions or pneumothorax. No acute osseous abnormality. IMPRESSION: No active cardiopulmonary disease. Electronically Signed   By: Feliberto Harts M.D.   On: 06/24/2022 15:57    Procedures Procedures (including critical care time)   Medications Ordered in UC Medications  ipratropium-albuterol (DUONEB) 0.5-2.5 (3) MG/3ML nebulizer solution 3 mL (3 mLs Nebulization Given 06/24/22 1601)  methylPREDNISolone sodium succinate (SOLU-MEDROL) 125 mg/2 mL injection 80 mg (80 mg Intramuscular Given 06/24/22 1632)    Initial Impression / Assessment and Plan / UC Course  I have reviewed the triage vital signs and the nursing notes.  Pertinent labs & imaging results that were available during my care of the patient were reviewed by me and considered in my medical decision making (see chart for details).   Patient is well-appearing, normotensive, afebrile, not tachycardic, not tachypneic, oxygenating well on room air.    Mild intermittent asthma with acute exacerbation Suspect secondary to viral cause Viral symptoms are improving, testing deferred at this time secondary to national shortage of testing supplies Chest x-ray today negative for acute cardiopulmonary process EKG similar when compared with previous; no ST segment or T wave abnormalities DuoNeb given in urgent care today with full relief of upper chest pressure and shortness of breath per patient's report Continue rescue inhaler every 4-6 hours as needed at home for shortness of breath Air movement increased to bilateral lower lung lobes after breathing  treatment Solu Medrol 80 mg IM given today in urgent care for lung inflammation Start oral prednisone tomorrow Strict ER precautions discussed with patient  The patient was given the opportunity to ask questions.  All questions answered to their satisfaction.  The patient is in agreement to this plan.    Final Clinical Impressions(s) / UC Diagnoses   Final diagnoses:  Mild intermittent asthma with acute exacerbation     Discharge Instructions      I suspect you have a viral upper respiratory infection that is causing your symptoms today.  We gave you a DuoNeb breathing treatment which helped open up your airway and helped resolved the chest pressure.  The chest x-ray today is negative for acute cardiopulmonary process; no pneumonia or lung infection.  The EKG also looks good today.  We have given you a shot of steroid today; start the oral steroid tomorrow morning when you wake up.  You can continue to use the albuterol inhaler every 4-6 hours as needed for wheezing or shortness of breath.  Refrain from using it more frequently than every 4 hours.    If you develop chest pain or shortness of breath, go to the ER.     ED Prescriptions     Medication Sig Dispense Auth. Provider   predniSONE (DELTASONE) 20 MG tablet Take 2 tablets (40 mg total) by mouth daily with breakfast for 5 days. 10 tablet Valentino Nose, NP      PDMP not reviewed this encounter.   Valentino Nose, NP 06/24/22 1637    Valentino Nose, NP 06/24/22 [phone]

## 2022-06-24 NOTE — Discharge Instructions (Addendum)
I suspect you have a viral upper respiratory infection that is causing your symptoms today.  We gave you a DuoNeb breathing treatment which helped open up your airway and helped resolved the chest pressure.  The chest x-ray today is negative for acute cardiopulmonary process; no pneumonia or lung infection.  The EKG also looks good today.  We have given you a shot of steroid today; start the oral steroid tomorrow morning when you wake up.  You can continue to use the albuterol inhaler every 4-6 hours as needed for wheezing or shortness of breath.  Refrain from using it more frequently than every 4 hours.    If you develop chest pain or shortness of breath, go to the ER.

## 2022-06-25 ENCOUNTER — Ambulatory Visit: Payer: 59

## 2022-10-10 ENCOUNTER — Ambulatory Visit
Admission: EM | Admit: 2022-10-10 | Discharge: 2022-10-10 | Disposition: A | Discharge status: Home / Self Care | Payer: Managed Care, Other (non HMO) | Attending: Nurse Practitioner | Admitting: Nurse Practitioner

## 2022-10-10 ENCOUNTER — Other Ambulatory Visit: Payer: Self-pay

## 2022-10-10 DIAGNOSIS — R198 Other specified symptoms and signs involving the digestive system and abdomen: Secondary | ICD-10-CM | POA: Diagnosis not present

## 2022-10-10 DIAGNOSIS — R140 Abdominal distension (gaseous): Secondary | ICD-10-CM | POA: Diagnosis not present

## 2022-10-10 DIAGNOSIS — R141 Gas pain: Secondary | ICD-10-CM | POA: Diagnosis not present

## 2022-10-10 DIAGNOSIS — R079 Chest pain, unspecified: Secondary | ICD-10-CM

## 2022-10-10 MED ORDER — SIMETHICONE 80 MG PO CHEW: 80.0000 mg | CHEWABLE_TABLET | Freq: Four times a day (QID) | ORAL | 0 refills | Status: AC | PRN

## 2022-10-10 MED ORDER — PANTOPRAZOLE SODIUM 40 MG PO TBEC: 40.0000 mg | DELAYED_RELEASE_TABLET | Freq: Every day | ORAL | 0 refills | Status: AC

## 2022-10-10 MED ORDER — LIDOCAINE VISCOUS HCL 2 % MT SOLN
15.0000 mL | Freq: Once | OROMUCOSAL | Status: AC
  Administered 2022-10-10: 15 mL via OROMUCOSAL

## 2022-10-10 MED ORDER — MYLANTA MAXIMUM STRENGTH 400-400-40 MG/5ML PO SUSP: 15.0000 mL | Freq: Four times a day (QID) | ORAL | 0 refills | Status: AC | PRN

## 2022-10-10 MED ORDER — ALUM & MAG HYDROXIDE-SIMETH 200-200-20 MG/5ML PO SUSP
30.0000 mL | Freq: Once | ORAL | Status: AC
  Administered 2022-10-10: 30 mL via ORAL

## 2022-10-10 NOTE — ED Triage Notes (Addendum)
Pt c/o chest pain, "feels like a balloon is being inflated  chest trying to burst out of chest per patient", felt a catch on the ribs,while relaxing excessive burping, pressure in the chest area comes and goes. X 3 days, has progressively gotten worse.  Pt has tried Pepto, Tums, Pepcid ac nothing has helped.

## 2022-10-10 NOTE — ED Provider Notes (Signed)
[facility]    CSN: [id number] Arrival date & time: 10/10/22  4098      History   Chief Complaint No chief complaint on file.   HPI Rhonda Jimenez is a 42 y.o. adult.   The history is provided by the patient.   Patient presents for complaints of bilateral chest pain that been present for the past 3 days.  Patient states the pain feels like "colic".  She further describes the pain as "a pressure in her chest like someone is pumping of a balloon".  She states that the pain radiates over her shoulders and into the back bilaterally.  She states that when the symptoms started, she did have an increased amount of belching.  She does complain of bloating as well.  She denies fever, chills, cough, wheezing, shortness of breath, difficulty breathing, abdominal pain, nausea, vomiting, or diarrhea.  Patient states that she does have a history of reflux disease, but currently does not take any medication for it.  She states that she normally eats 1 meal per day, but does have an increased amount of caffeine.  She states that she has been taking Pepto, Tums, Pepcid, with minimal relief.  Past Medical History:  Diagnosis Date   Asthma    as child   Lumbar herniated disc    Migraine     There are no problems to display for this patient.   History reviewed. No pertinent surgical history.  OB History   No obstetric history on file.      Home Medications    Prior to Admission medications   Medication Sig Start Date End Date Taking? Authorizing Provider  alum & mag hydroxide-simeth (MYLANTA MAXIMUM STRENGTH) 400-400-40 MG/5ML suspension Take 15 mLs by mouth every 6 (six) hours as needed for up to 10 days for indigestion. 10/10/22 10/20/22 Yes Kess Mcilwain-Warren, Sadie Haber, NP  pantoprazole (PROTONIX) 40 MG tablet Take 1 tablet (40 mg total) by mouth daily. 10/10/22 11/09/22 Yes Graylen Noboa-Warren, Sadie Haber, NP  simethicone (GAS-X) 80 MG chewable tablet Chew 1 tablet (80 mg total) by mouth every  6 (six) hours as needed for flatulence. 10/10/22  Yes Lydia Meng-Warren, Sadie Haber, NP  albuterol (VENTOLIN HFA) 108 (90 Base) MCG/ACT inhaler SMARTSIG:2 Puff(s) By Mouth Every 4 Hours PRN 09/03/21   [provider]  cetirizine (ZYRTEC) 10 MG tablet Take 10 mg by mouth daily.    [provider]  cyclobenzaprine (FLEXERIL) 10 MG tablet Take 1 tablet (10 mg total) by mouth 2 (two) times daily as needed for muscle spasms. 12/06/20   Carroll Sage, PA-C  naproxen (NAPROSYN) 500 MG tablet Take 500 mg by mouth 2 (two) times daily with a meal.    [provider]  ondansetron (ZOFRAN ODT) 4 MG disintegrating tablet Take 1 tablet (4 mg total) by mouth every 8 (eight) hours as needed for nausea or vomiting. 02/28/21   Drema Dallas, DO  oxyCODONE-acetaminophen (PERCOCET/ROXICET) 5-325 MG tablet Take 1 tablet by mouth every 4 (four) hours as needed for severe pain.    [provider]  rizatriptan (MAXALT-MLT) 10 MG disintegrating tablet Take 1 tablet (10 mg total) by mouth as needed for migraine (May repeat in 2 hours.  Maximum 2 tablets in 24 hours.). May repeat in 2 hours if needed 09/13/21   Drema Dallas, DO    Family History Family History  Problem Relation Age of Onset   Stroke Mother    Lupus Mother    Arthritis-Osteo Mother  Heart attack Father    Lupus Sister    Fibromyalgia Sister     Social History Social History   Tobacco Use   Smoking status: Former   Smokeless tobacco: Never  Substance Use Topics   Alcohol use: No     Allergies   Patient has no known allergies.   Review of Systems Review of Systems Per HPI  Physical Exam Triage Vital Signs ED Triage Vitals  Enc Vitals Group     BP 10/10/22 1003 125/84     Pulse Rate 10/10/22 1003 84     Resp 10/10/22 1003 20     Temp 10/10/22 1003 98.5 F (36.9 C)     Temp src --      SpO2 10/10/22 1003 99 %     Weight --      Height --      Head Circumference --      Peak Flow --      Pain  Score 10/10/22 1007 6     Pain Loc --      Pain Edu? --      Excl. in GC? --    No data found.  Updated Vital Signs BP 125/84 (BP Location: Right Arm)   Pulse 84   Temp 98.5 F (36.9 C)   Resp 20   SpO2 99%   Visual Acuity Right Eye Distance:   Left Eye Distance:   Bilateral Distance:    Right Eye Near:   Left Eye Near:    Bilateral Near:     Physical Exam Vitals and nursing note reviewed.  Constitutional:      General: Rhonda Jimenez is not in acute distress.    Appearance: Normal appearance.  HENT:     Head: Normocephalic.  Eyes:     Extraocular Movements: Extraocular movements intact.     Conjunctiva/sclera: Conjunctivae normal.     Pupils: Pupils are equal, round, and reactive to light.  Cardiovascular:     Rate and Rhythm: Normal rate and regular rhythm.     Pulses: Normal pulses.     Heart sounds: Normal heart sounds.  Pulmonary:     Effort: Pulmonary effort is normal. No respiratory distress.     Breath sounds: Normal breath sounds. No stridor. No wheezing, rhonchi or rales.  Chest:     Chest wall: Tenderness present. No swelling or edema.     Comments: Bilateral chest tenderness noted to the anterior chest wall above the breast.  Tenderness is increased on the right side. Abdominal:     General: Bowel sounds are normal.     Palpations: Abdomen is soft.     Tenderness: There is no abdominal tenderness.     Comments: GI cocktail was administered.  Patient reports improvement of her symptoms.  Musculoskeletal:     Cervical back: Normal range of motion.  Skin:    General: Skin is warm and dry.  Neurological:     General: No focal deficit present.     Mental Status: Rhonda Jimenez is alert and oriented to person, place, and time.  Psychiatric:        Mood and Affect: Mood normal.        Behavior: Behavior normal.      UC Treatments / Results  Labs (all labs ordered are listed, but only abnormal results are displayed) Labs Reviewed - No data to  display  EKG: NSR without ectopy, compared to EKG performed on 06/24/2022.   Radiology No results found.  Procedures Procedures (including critical care time)  Medications Ordered in UC Medications  alum & mag hydroxide-simeth (MAALOX/MYLANTA) 200-200-20 MG/5ML suspension 30 mL (30 mLs Oral Given 10/10/22 1039)  lidocaine (XYLOCAINE) 2 % viscous mouth solution 15 mL (15 mLs Mouth/Throat Given 10/10/22 1039)    Initial Impression / Assessment and Plan / UC Course  I have reviewed the triage vital signs and the nursing notes.  Pertinent labs & imaging results that were available during my care of the patient were reviewed by me and considered in my medical decision making (see chart for details).  The patient is well-appearing, she is in no acute distress, vital signs are stable.  Patient presents for complaints of chest pain.  Symptoms were improved with GI cocktail.  EKG showed normal sinus rhythm with no ectopy.  Symptoms appear to be related to gas pain, most likely caused by acid reflux symptoms.  Patient was prescribed Mylanta maximum strength to help with gas and bloating, Protonix 40 mg daily to help with reflux symptoms, and simethicone 80 mg chewable tablets for gas and bloating.  Supportive care recommendations were provided and discussed with the patient to include avoiding foods to aggravate her reflux symptoms, eating smaller meals, and allowing for plenty of rest.  Patient was given indications to follow-up in the emergency department if her chest pain does not improve.  Patient was also given information to establish care with a local primary care physician in this office.  Patient is in agreement with this plan of care and verbalizes understanding.  All questions were answered.  Patient stable for discharge.   Final Clinical Impressions(s) / UC Diagnoses   Final diagnoses:  Symptoms of gastroesophageal reflux  Chest pain, unspecified type  Bloating  Gas pain      Discharge Instructions      Take medication as prescribed. Increase fluids and allow for plenty of rest. Recommend eating 6 small meals each day instead of 3 large meals.  Also recommend eating 2 to 3 hours before lying down. Avoid spicy foods, tomato-based foods, dairy, caffeine, and chocolate as this may aggravate your symptoms. As discussed, if you develop worsening chest pain, please follow-up in the emergency department for further evaluation. We are providing information for you today to establish care with a primary care physician.  Please contact their office as soon as possible to schedule an appointment. Follow-up as needed.     ED Prescriptions     Medication Sig Dispense Auth. Provider   alum & mag hydroxide-simeth (MYLANTA MAXIMUM STRENGTH) 400-400-40 MG/5ML suspension Take 15 mLs by mouth every 6 (six) hours as needed for up to 10 days for indigestion. 355 mL Rhonda Jimenez, Sadie Haber, NP   pantoprazole (PROTONIX) 40 MG tablet Take 1 tablet (40 mg total) by mouth daily. 30 tablet Rhonda Jimenez, Sadie Haber, NP   simethicone (GAS-X) 80 MG chewable tablet Chew 1 tablet (80 mg total) by mouth every 6 (six) hours as needed for flatulence. 30 tablet Rhonda Jimenez, Sadie Haber, NP      PDMP not reviewed this encounter.   Abran Cantor, NP 10/10/22 1112

## 2022-10-10 NOTE — Discharge Instructions (Signed)
Take medication as prescribed. Increase fluids and allow for plenty of rest. Recommend eating 6 small meals each day instead of 3 large meals.  Also recommend eating 2 to 3 hours before lying down. Avoid spicy foods, tomato-based foods, dairy, caffeine, and chocolate as this may aggravate your symptoms. As discussed, if you develop worsening chest pain, please follow-up in the emergency department for further evaluation. We are providing information for you today to establish care with a primary care physician.  Please contact their office as soon as possible to schedule an appointment. Follow-up as needed.

## 2022-12-03 IMAGING — MR MR HEAD WO/W CM
12 series · 48 of 48 positions shown · IV contrast (multihance)
Comparison: No prior MRI, correlation is made with 12/06/2020 CT
head

CLINICAL DATA: Right-sided numbness, occipital lobe headaches

EXAM:
MRI HEAD WITHOUT AND WITH CONTRAST
TECHNIQUE: Multiplanar, multiecho pulse sequences of the brain and surrounding
structures were obtained without and with intravenous contrast.
CONTRAST:  17mL MULTIHANCE GADOBENATE DIMEGLUMINE 529 MG/ML IV SOLN

[Series 2: T1 · sagittal · 5.0mm · 0.45mm/px · 2 of 23 slices shown]
[im 1/23]
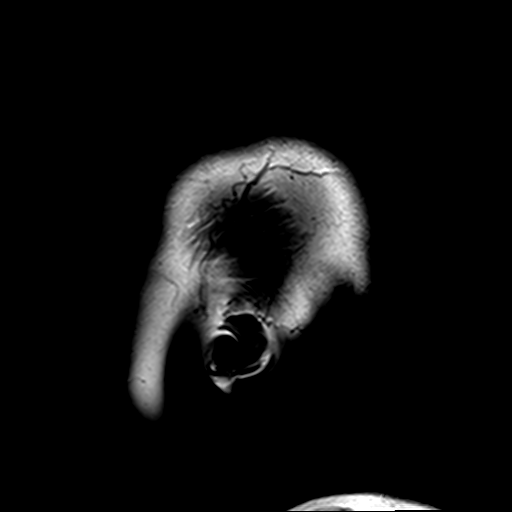
[im 23/23]
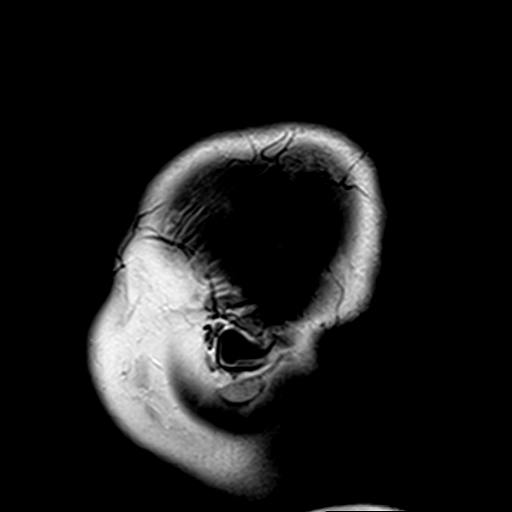

[Series 3: DWI · axial · 3.0mm · 1.80mm/px · z∈[-57,+89]mm · 7 of 100 slices shown (1 of 4)]
[im 1/100]
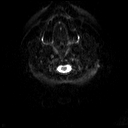
[im 17/100]
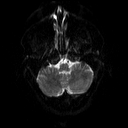
[im 34/100]
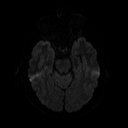
[im 50/100]
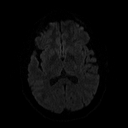
[im 67/100]
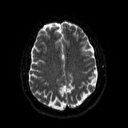
[im 83/100]
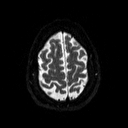
[im 100/100]
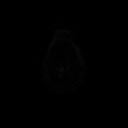

[Series 4: DWI · axial · 3.0mm · 1.80mm/px · z∈[-57,+89]mm · 3 of 49 slices shown (2 of 4)]
[im 1/49]
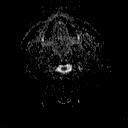
[im 25/49]
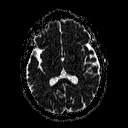
[im 49/49]
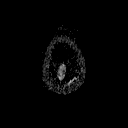

[Series 5: DWI · coronal · 5.0mm · 1.80mm/px · 5 of 72 slices shown (3 of 4)]
[im 1/72]
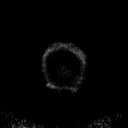
[im 18/72]
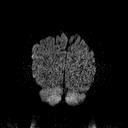
[im 36/72]
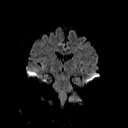
[im 54/72]
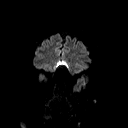
[im 72/72]
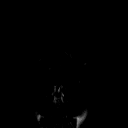

[Series 6: DWI · coronal · 5.0mm · 1.80mm/px · 2 of 36 slices shown (4 of 4)]
[im 1/36]
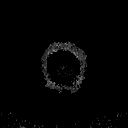
[im 36/36]
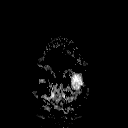

[Series 7: T2 · axial · 5.0mm · 0.60mm/px · 1 of 22 slices shown (1 of 2)]
[im 1/22]
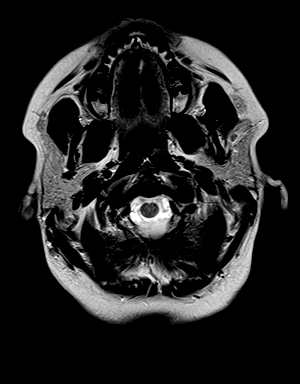

[Series 8: FLAIR · axial · 3.0mm · 0.45mm/px · z∈[-51,+83]mm · 2 of 30 slices shown]
[im 1/30]
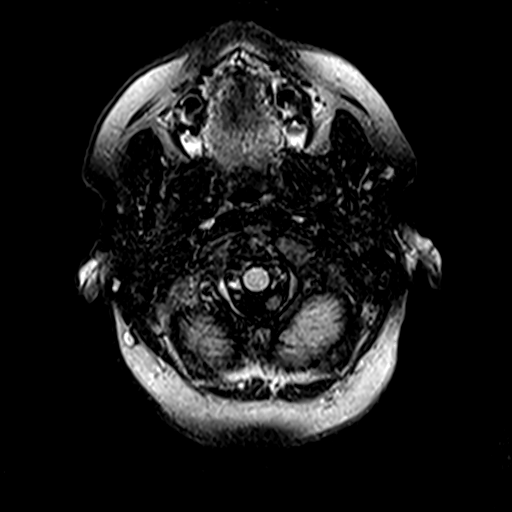
[im 30/30]
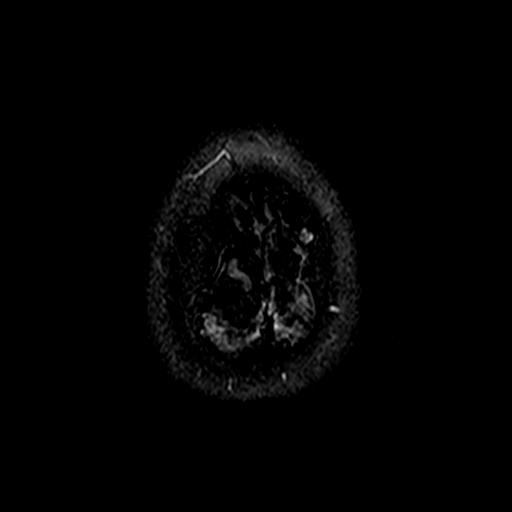

[Series 10: swi_images · axial · 4.0mm · 0.90mm/px · z∈[-54,+86]mm · 2 of 36 slices shown]
[im 1/36]
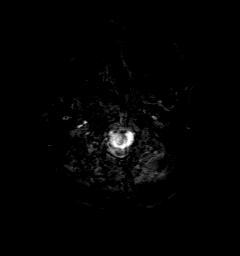
[im 36/36]
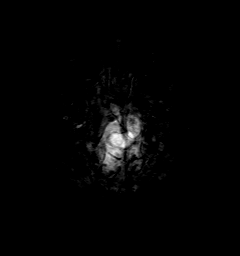

[Series 11: t1_mpr_tra · axial · 1.0mm · 0.75mm/px · z∈[-58,+87]mm · 10 of 144 slices shown]
[im 1/144]
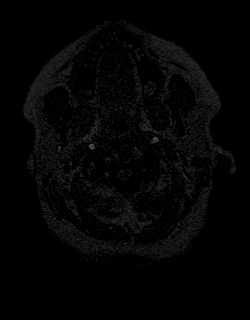
[im 16/144]
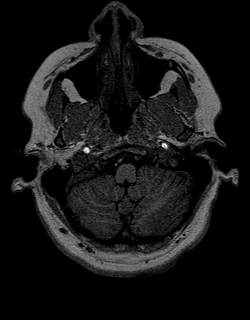
[im 32/144]
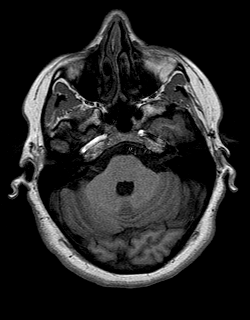
[im 48/144]
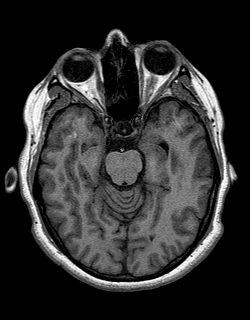
[im 64/144]
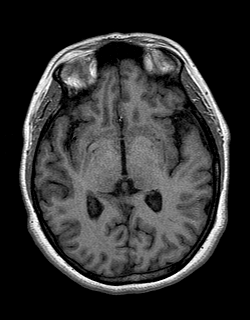
[im 80/144]
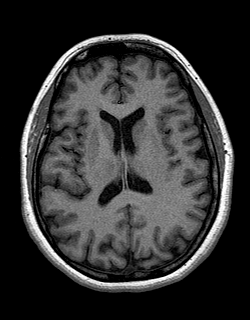
[im 96/144]
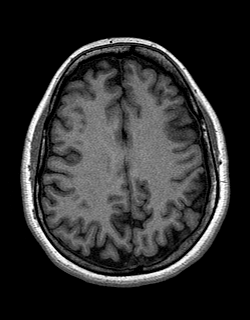
[im 112/144]
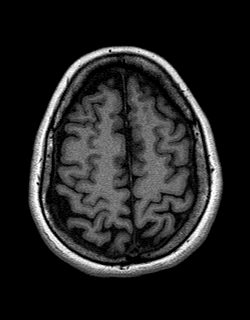
[im 128/144]
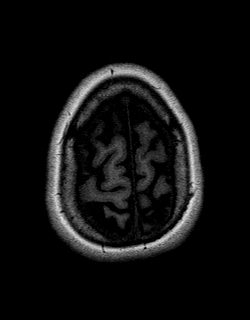
[im 144/144]
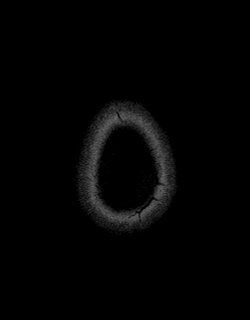

[Series 12: T2 · coronal · 5.0mm · 0.45mm/px · 2 of 28 slices shown (2 of 2)]
[im 1/28]
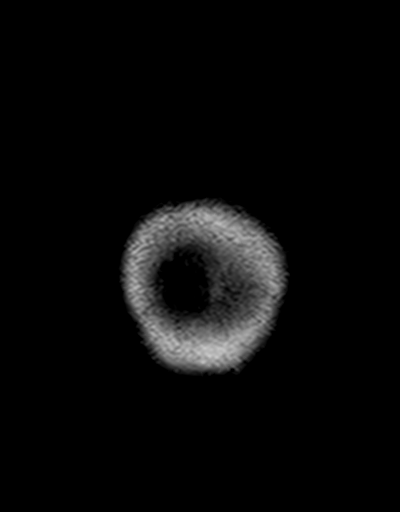
[im 28/28]
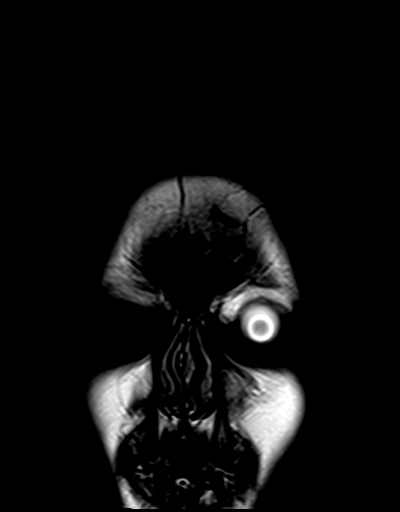

[Series 13: t1_mpr_tra post · axial · 1.0mm · 0.75mm/px · z∈[-58,+87]mm · 10 of 144 slices shown]
[im 1/144]
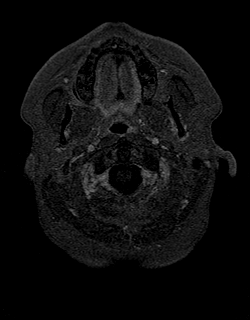
[im 16/144]
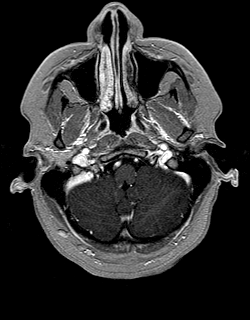
[im 32/144]
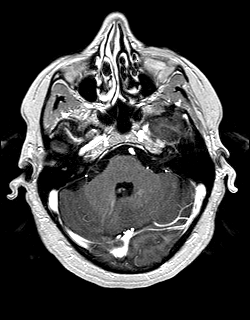
[im 48/144]
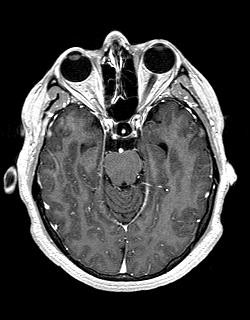
[im 64/144]
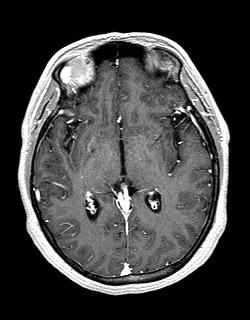
[im 80/144]
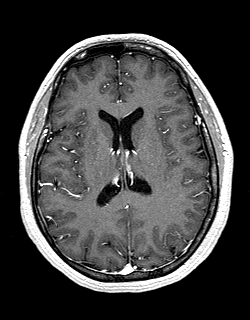
[im 96/144]
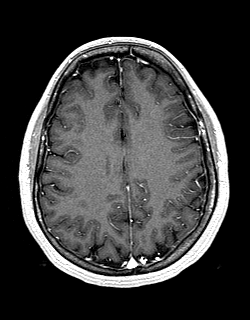
[im 112/144]
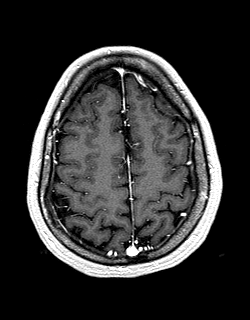
[im 128/144]
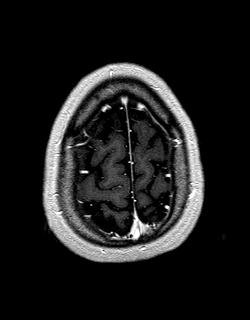
[im 144/144]
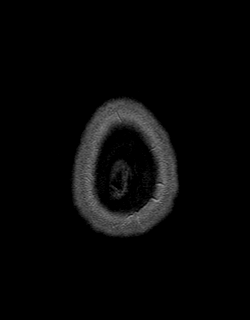

[Series 14: post cor · coronal · 5.0mm · 0.45mm/px · 2 of 28 slices shown]
[im 1/28]
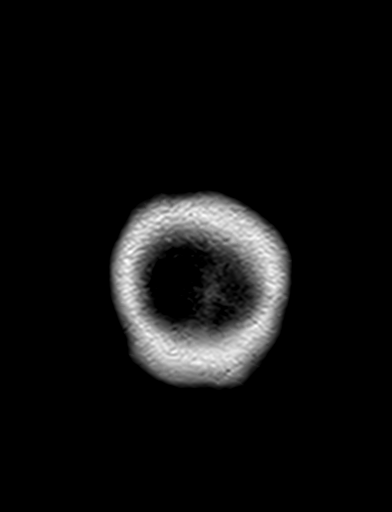
[im 28/28]
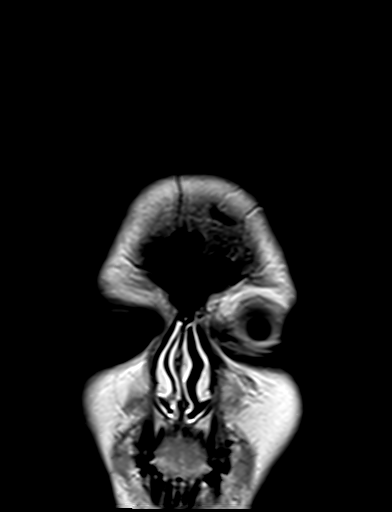

[48 of 48 positions shown; findings below may reference images not displayed]

FINDINGS: Brain: No acute infarction, hemorrhage, hydrocephalus, extra-axial
collection, or mass lesion. The ventricles and sulci are within
normal limits for age. No abnormal enhancement.

Vascular: Normal flow voids.

Skull and upper cervical spine: Normal marrow signal.

Sinuses/Orbits: Mild mucosal thickening in the ethmoid air cells.
Otherwise negative.

Other: The mastoids are well aerated.
IMPRESSION: No acute intracranial process. No etiology is seen for the patient's
headaches or numbness

## 2023-05-01 ENCOUNTER — Ambulatory Visit
Admission: EM | Admit: 2023-05-01 | Discharge: 2023-05-01 | Disposition: A | Discharge status: Home / Self Care | Payer: Managed Care, Other (non HMO) | Attending: Family Medicine | Admitting: Family Medicine

## 2023-05-01 DIAGNOSIS — L0291 Cutaneous abscess, unspecified: Secondary | ICD-10-CM | POA: Diagnosis not present

## 2023-05-01 MED ORDER — CEPHALEXIN 500 MG PO CAPS: 500.0000 mg | ORAL_CAPSULE | Freq: Two times a day (BID) | ORAL | 0 refills | Status: AC

## 2023-05-01 NOTE — ED Triage Notes (Signed)
Lower abdomin bump/cyst that drains yellow fluid that comes and goes x 5 months. Pt states it flares up during her period then goes away but this past month it has stayed for 3 weeks and its draining.

## 2023-05-01 NOTE — ED Provider Notes (Signed)
[facility]    CSN: [id number] Arrival date & time: 05/01/23  1133      History   Chief Complaint Chief Complaint  Patient presents with   Cyst    HPI Rhonda Jimenez is a 42 y.o. adult.   Patient presenting today with 5 months of off-and-on abscess to the left lower abdomen.  States its always come and gone on its own but now the past 3 weeks it has gotten worse and is now draining yellow pus.  Denies fever, chills, injury to the area, nausea, body aches.  Trying Neosporin with minimal relief.    Past Medical History:  Diagnosis Date   Asthma    as child   Lumbar herniated disc    Migraine     There are no problems to display for this patient.   History reviewed. No pertinent surgical history.  OB History   No obstetric history on file.      Home Medications    Prior to Admission medications   Medication Sig Start Date End Date Taking? Authorizing Provider  cephALEXin (KEFLEX) 500 MG capsule Take 1 capsule (500 mg total) by mouth 2 (two) times daily. 05/01/23  Yes Particia Nearing, PA-C  cetirizine (ZYRTEC) 10 MG tablet Take 10 mg by mouth daily.   Yes [provider]  albuterol (VENTOLIN HFA) 108 (90 Base) MCG/ACT inhaler SMARTSIG:2 Puff(s) By Mouth Every 4 Hours PRN 09/03/21   [provider]  cyclobenzaprine (FLEXERIL) 10 MG tablet Take 1 tablet (10 mg total) by mouth 2 (two) times daily as needed for muscle spasms. 12/06/20   Carroll Sage, PA-C  naproxen (NAPROSYN) 500 MG tablet Take 500 mg by mouth 2 (two) times daily with a meal.    [provider]  ondansetron (ZOFRAN ODT) 4 MG disintegrating tablet Take 1 tablet (4 mg total) by mouth every 8 (eight) hours as needed for nausea or vomiting. 02/28/21   Drema Dallas, DO  oxyCODONE-acetaminophen (PERCOCET/ROXICET) 5-325 MG tablet Take 1 tablet by mouth every 4 (four) hours as needed for severe pain.    [provider]  pantoprazole (PROTONIX) 40 MG tablet  Take 1 tablet (40 mg total) by mouth daily. 10/10/22 11/09/22  Leath-Warren, Sadie Haber, NP  rizatriptan (MAXALT-MLT) 10 MG disintegrating tablet Take 1 tablet (10 mg total) by mouth as needed for migraine (May repeat in 2 hours.  Maximum 2 tablets in 24 hours.). May repeat in 2 hours if needed 09/13/21   Drema Dallas, DO  simethicone (GAS-X) 80 MG chewable tablet Chew 1 tablet (80 mg total) by mouth every 6 (six) hours as needed for flatulence. 10/10/22   Leath-Warren, Sadie Haber, NP    Family History Family History  Problem Relation Age of Onset   Stroke Mother    Lupus Mother    Arthritis-Osteo Mother    Heart attack Father    Lupus Sister    Fibromyalgia Sister     Social History Social History   Tobacco Use   Smoking status: Former   Smokeless tobacco: Never  Substance Use Topics   Alcohol use: No     Allergies   Patient has no known allergies.   Review of Systems Review of Systems PER HPI  Physical Exam Triage Vital Signs ED Triage Vitals [05/01/23 1149]  Encounter Vitals Group     BP 115/78     Systolic BP Percentile      Diastolic BP Percentile  Pulse Rate 92     Resp 16     Temp 99 F (37.2 C)     Temp Source Oral     SpO2      Weight      Height      Head Circumference      Peak Flow      Pain Score 0     Pain Loc      Pain Education      Exclude from Growth Chart    No data found.  Updated Vital Signs BP 115/78 (BP Location: Right Arm)   Pulse 92   Temp 99 F (37.2 C) (Oral)   Resp 16   LMP 04/21/2023 (Approximate)   Visual Acuity Right Eye Distance:   Left Eye Distance:   Bilateral Distance:    Right Eye Near:   Left Eye Near:    Bilateral Near:     Physical Exam Vitals and nursing note reviewed.  Constitutional:      Appearance: Normal appearance.  HENT:     Head: Atraumatic.  Eyes:     Extraocular Movements: Extraocular movements intact.     Conjunctiva/sclera: Conjunctivae normal.  Cardiovascular:     Rate and  Rhythm: Normal rate and regular rhythm.  Pulmonary:     Effort: Pulmonary effort is normal.     Breath sounds: Normal breath sounds.  Musculoskeletal:        General: Normal range of motion.     Cervical back: Normal range of motion and neck supple.  Skin:    General: Skin is warm.     Comments: 0.5 cm erythematous abscess with active drainage to the left lower abdomen  Neurological:     General: No focal deficit present.     Mental Status: Rhonda Jimenez is oriented to person, place, and time.  Psychiatric:        Mood and Affect: Mood normal.        Thought Content: Thought content normal.        Judgment: Judgment normal.      UC Treatments / Results  Labs (all labs ordered are listed, but only abnormal results are displayed) Labs Reviewed - No data to display  EKG   Radiology No results found.  Procedures Procedures (including critical care time)  Medications Ordered in UC Medications - No data to display  Initial Impression / Assessment and Plan / UC Course  I have reviewed the triage vital signs and the nursing notes.  Pertinent labs & imaging results that were available during my care of the patient were reviewed by me and considered in my medical decision making (see chart for details).     Spontaneously draining so no indication for I&D today.  Treat with Keflex, warm compresses, good topical wound care.  Return for worsening symptoms. Final Clinical Impressions(s) / UC Diagnoses   Final diagnoses:  Abscess     Discharge Instructions      Take the full course of antibiotics, clean the area with either soap and water or Hibiclens and apply mupirocin ointment and a dressing at least once daily.  Follow-up for worsening or unresolving symptoms    ED Prescriptions     Medication Sig Dispense Auth. Provider   cephALEXin (KEFLEX) 500 MG capsule Take 1 capsule (500 mg total) by mouth 2 (two) times daily. 14 capsule Particia Nearing, [place]      PDMP not  reviewed this encounter.   Particia Nearing, [place] 05/01/23 1233

## 2023-05-01 NOTE — Discharge Instructions (Signed)
Take the full course of antibiotics, clean the area with either soap and water or Hibiclens and apply mupirocin ointment and a dressing at least once daily.  Follow-up for worsening or unresolving symptoms
